# Patient Record
Sex: Female | Born: 2005 | Race: White | Hispanic: No | Marital: Single
Health system: Southern US, Community
[De-identification: ages and names within clinical notes are randomized; demographics above are authoritative.]

---
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
## 2023-01-18 IMAGING — CR DG WRIST COMPLETE 3+V*L*
4 series · 4 of 4 positions shown · non-contrast
Comparison: None Available.

CLINICAL DATA: Fall with wrist pain

EXAM:
LEFT WRIST - COMPLETE 3+ VIEW

[x wrist pa left]
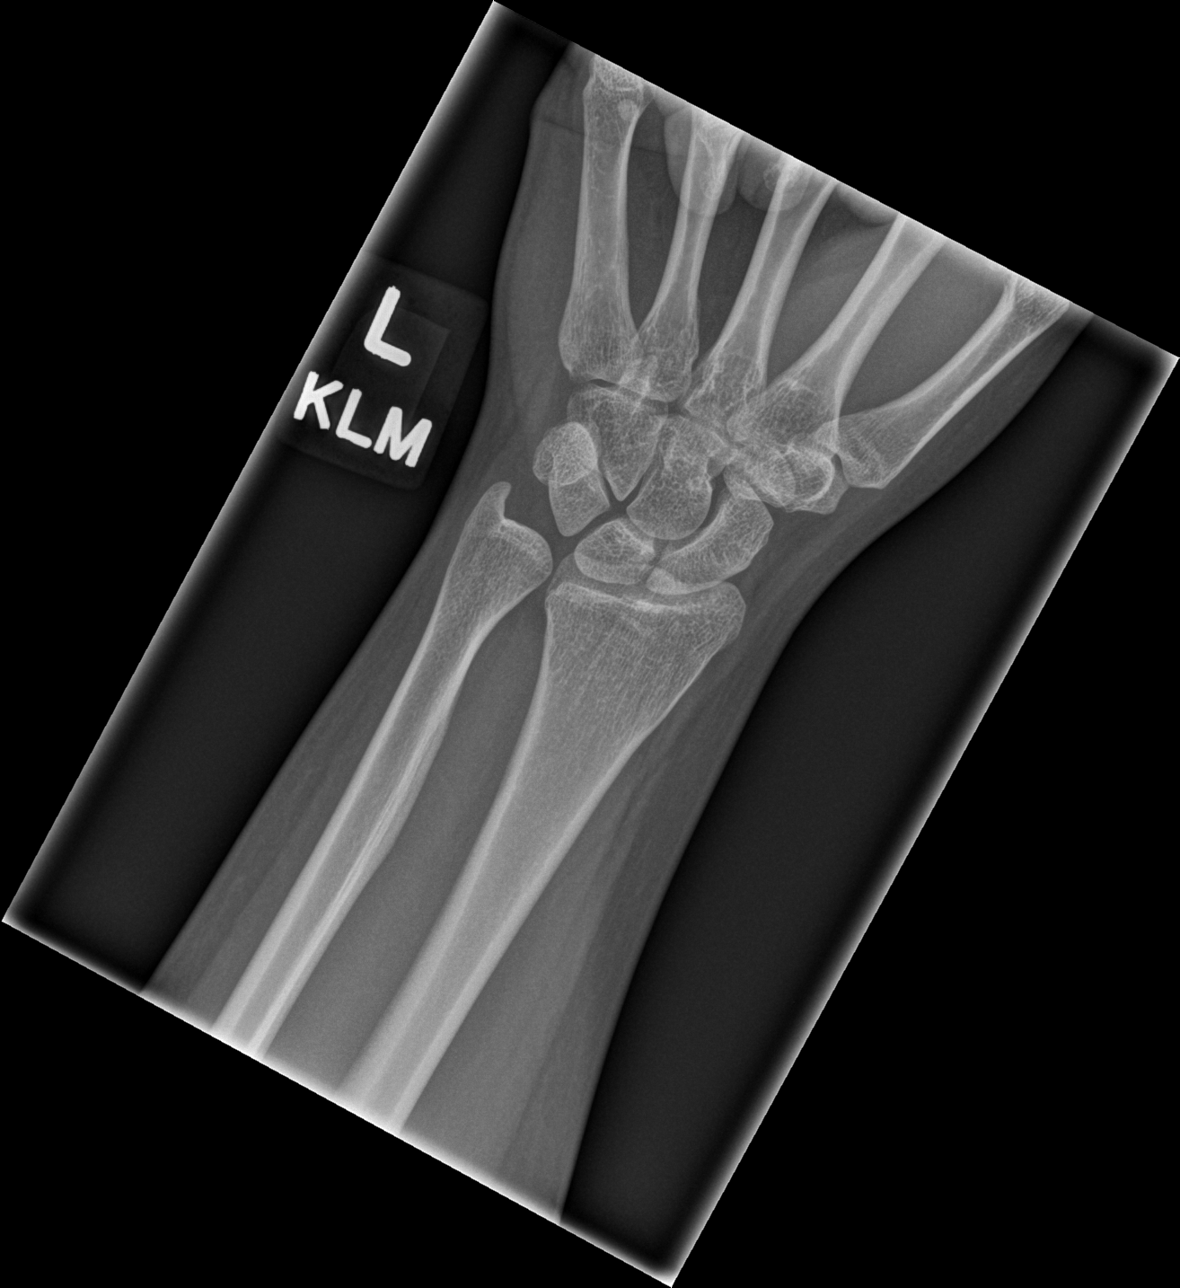

[x wrist obl left]
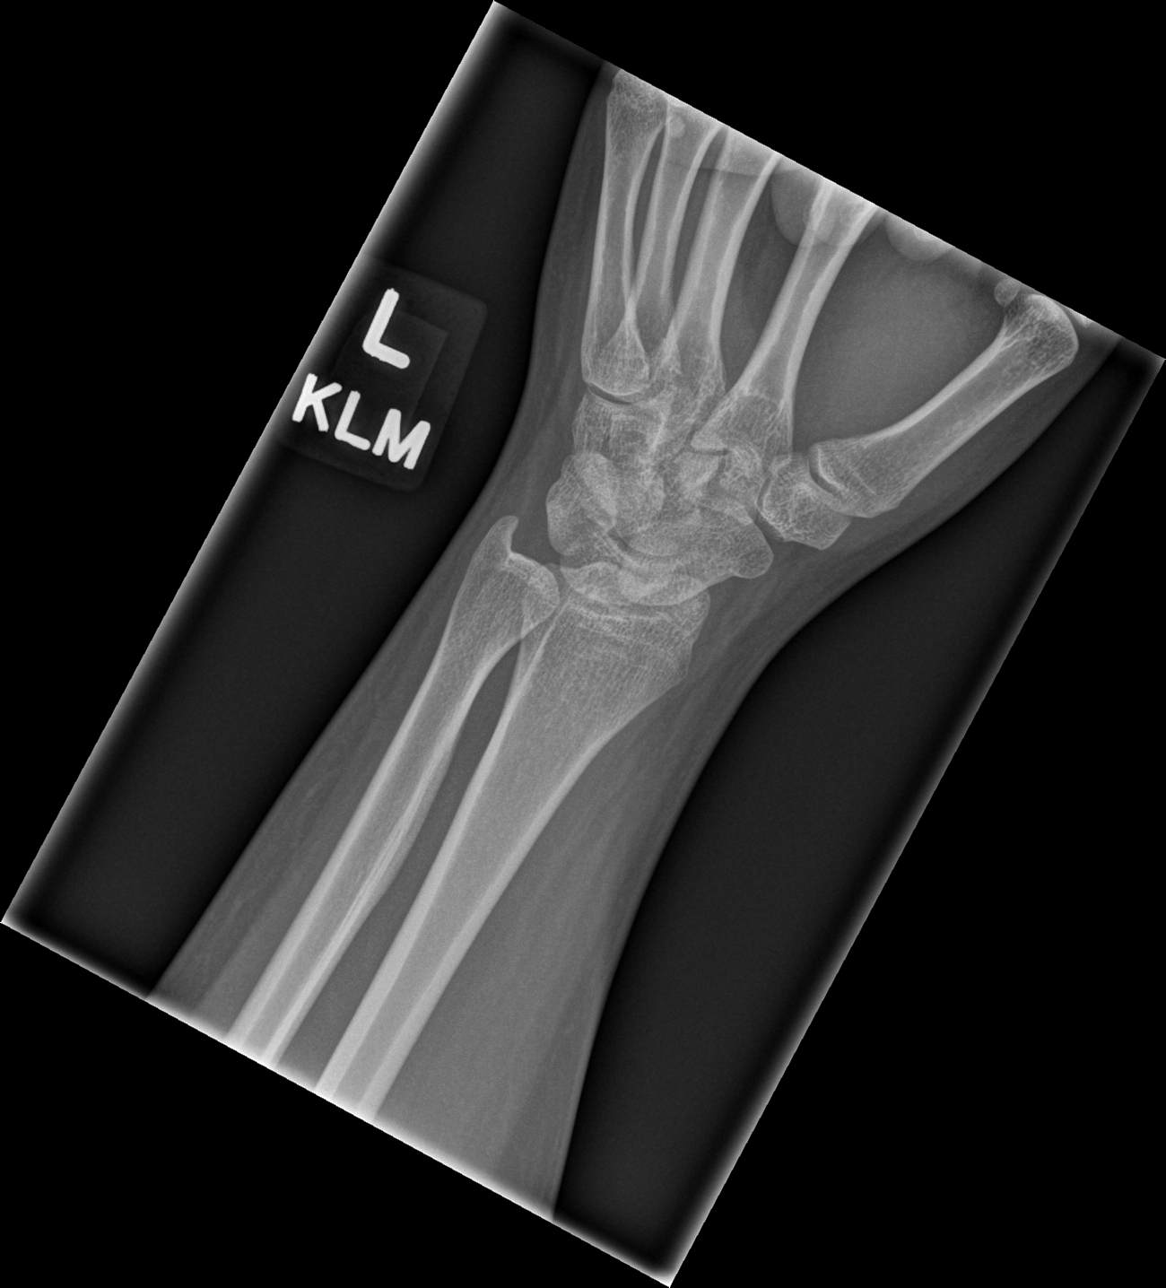

[x wrist lat left (1 of 2)]
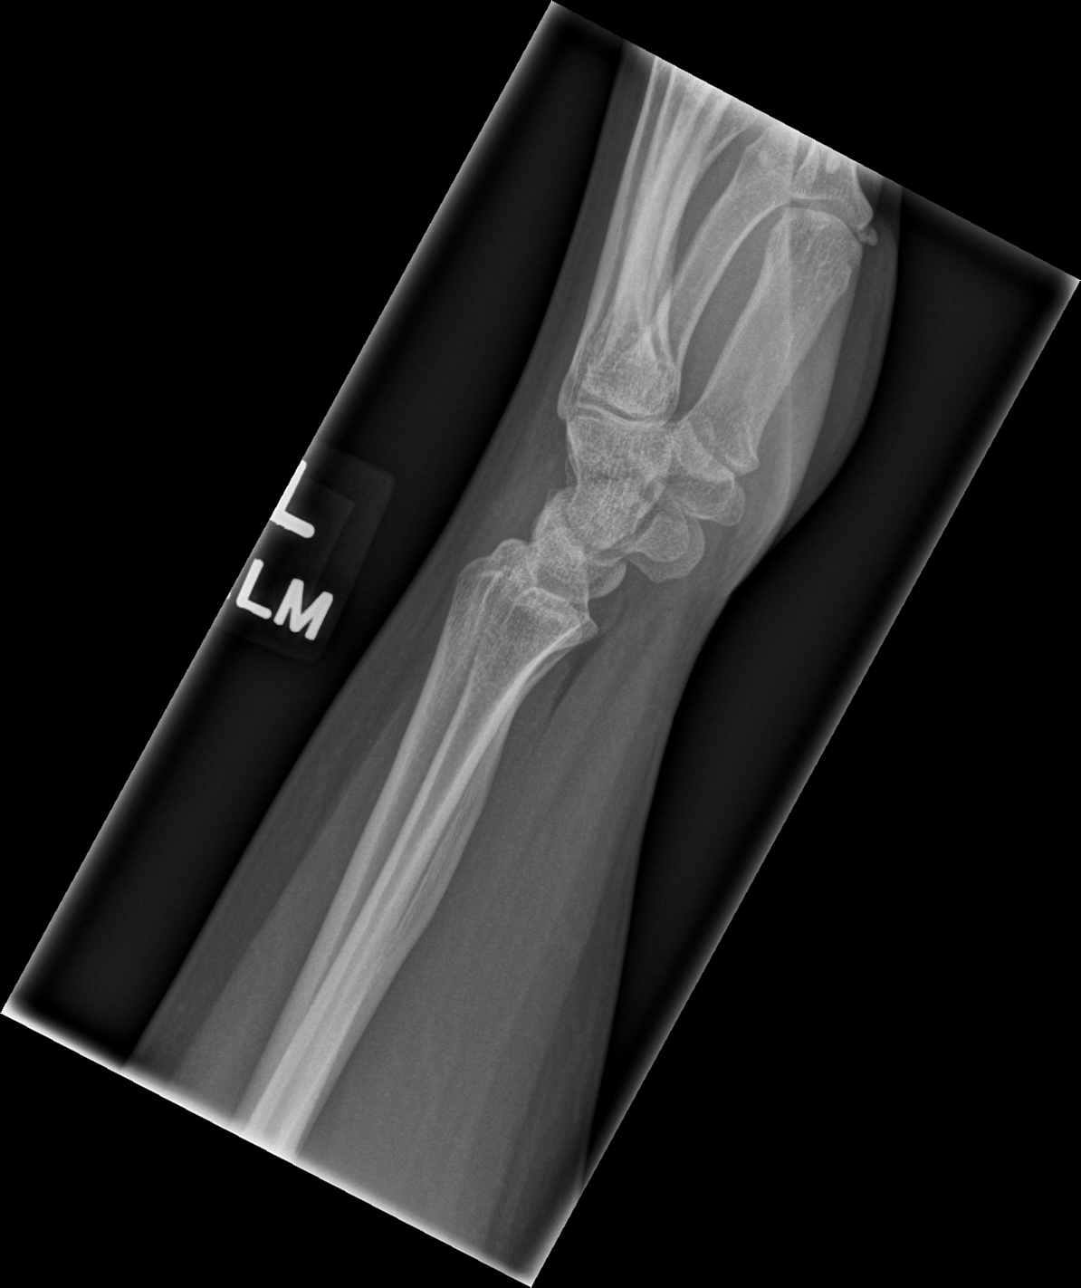

[x wrist lat left (2 of 2)]
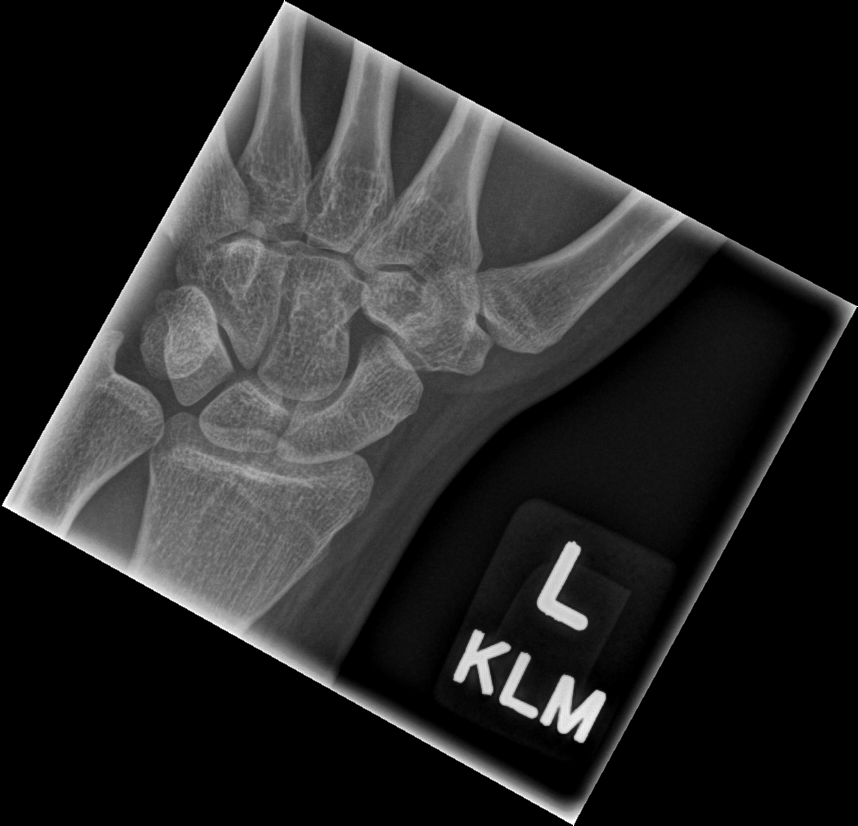

[4 of 4 positions shown; findings below may reference images not displayed]

FINDINGS: There is no evidence of fracture or dislocation. There is no
evidence of arthropathy or other focal bone abnormality. Soft
tissues are unremarkable.
IMPRESSION: Negative.
